# Patient Record
Sex: Female | Born: 2000 | Hispanic: No | Marital: Single | Smoking: Never smoker
Health system: Southern US, Community
[De-identification: ages and names within clinical notes are randomized; demographics above are authoritative.]

## PROBLEM LIST (undated history)

## (undated) DIAGNOSIS — A6000 Herpesviral infection of urogenital system, unspecified: Secondary | ICD-10-CM

## (undated) DIAGNOSIS — M1990 Unspecified osteoarthritis, unspecified site: Secondary | ICD-10-CM

---
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
## 2019-09-11 DIAGNOSIS — Z20822 Contact with and (suspected) exposure to covid-19: Secondary | ICD-10-CM | POA: Diagnosis not present

## 2019-09-11 DIAGNOSIS — Z03818 Encounter for observation for suspected exposure to other biological agents ruled out: Secondary | ICD-10-CM | POA: Diagnosis not present

## 2020-07-14 DIAGNOSIS — D509 Iron deficiency anemia, unspecified: Secondary | ICD-10-CM | POA: Diagnosis not present

## 2020-07-14 DIAGNOSIS — Z79899 Other long term (current) drug therapy: Secondary | ICD-10-CM | POA: Diagnosis not present

## 2020-07-14 DIAGNOSIS — M059 Rheumatoid arthritis with rheumatoid factor, unspecified: Secondary | ICD-10-CM | POA: Diagnosis not present

## 2020-07-19 DIAGNOSIS — A084 Viral intestinal infection, unspecified: Secondary | ICD-10-CM | POA: Diagnosis not present

## 2020-11-02 DIAGNOSIS — N920 Excessive and frequent menstruation with regular cycle: Secondary | ICD-10-CM | POA: Diagnosis not present

## 2020-11-02 DIAGNOSIS — M069 Rheumatoid arthritis, unspecified: Secondary | ICD-10-CM | POA: Diagnosis not present

## 2020-11-02 DIAGNOSIS — D509 Iron deficiency anemia, unspecified: Secondary | ICD-10-CM | POA: Diagnosis not present

## 2021-02-15 DIAGNOSIS — M0580 Other rheumatoid arthritis with rheumatoid factor of unspecified site: Secondary | ICD-10-CM | POA: Diagnosis not present

## 2021-02-15 DIAGNOSIS — M778 Other enthesopathies, not elsewhere classified: Secondary | ICD-10-CM | POA: Diagnosis not present

## 2021-02-15 DIAGNOSIS — D509 Iron deficiency anemia, unspecified: Secondary | ICD-10-CM | POA: Diagnosis not present

## 2021-02-15 DIAGNOSIS — Z79620 Long term (current) use of immunosuppressive biologic: Secondary | ICD-10-CM | POA: Diagnosis not present

## 2021-02-15 DIAGNOSIS — Z8739 Personal history of other diseases of the musculoskeletal system and connective tissue: Secondary | ICD-10-CM | POA: Diagnosis not present

## 2021-11-28 DIAGNOSIS — Z79620 Long term (current) use of immunosuppressive biologic: Secondary | ICD-10-CM | POA: Diagnosis not present

## 2021-11-28 DIAGNOSIS — D509 Iron deficiency anemia, unspecified: Secondary | ICD-10-CM | POA: Diagnosis not present

## 2021-11-28 DIAGNOSIS — M059 Rheumatoid arthritis with rheumatoid factor, unspecified: Secondary | ICD-10-CM | POA: Diagnosis not present

## 2022-05-17 ENCOUNTER — Encounter (HOSPITAL_COMMUNITY): Payer: Self-pay | Admitting: Emergency Medicine

## 2022-05-17 ENCOUNTER — Emergency Department (HOSPITAL_COMMUNITY): Payer: Medicaid Other

## 2022-05-17 ENCOUNTER — Other Ambulatory Visit: Payer: Self-pay

## 2022-05-17 ENCOUNTER — Emergency Department (HOSPITAL_COMMUNITY)
Admission: EM | Admit: 2022-05-17 | Discharge: 2022-05-17 | Disposition: A | Discharge status: Home / Self Care | Payer: Medicaid Other | Attending: Emergency Medicine | Admitting: Emergency Medicine

## 2022-05-17 DIAGNOSIS — K8020 Calculus of gallbladder without cholecystitis without obstruction: Secondary | ICD-10-CM | POA: Diagnosis not present

## 2022-05-17 DIAGNOSIS — N3000 Acute cystitis without hematuria: Secondary | ICD-10-CM | POA: Insufficient documentation

## 2022-05-17 DIAGNOSIS — R1013 Epigastric pain: Secondary | ICD-10-CM | POA: Diagnosis present

## 2022-05-17 LAB — URINALYSIS, ROUTINE W REFLEX MICROSCOPIC
Bilirubin Urine: NEGATIVE
Glucose, UA: NEGATIVE mg/dL
Hgb urine dipstick: NEGATIVE
Ketones, ur: NEGATIVE mg/dL
Nitrite: NEGATIVE
Protein, ur: NEGATIVE mg/dL
Specific Gravity, Urine: 1.03 (ref 1.005–1.030)
pH: 5 (ref 5.0–8.0)

## 2022-05-17 LAB — COMPREHENSIVE METABOLIC PANEL
ALT: 13 U/L (ref 0–44)
AST: 20 U/L (ref 15–41)
Albumin: 3.9 g/dL (ref 3.5–5.0)
Alkaline Phosphatase: 69 U/L (ref 38–126)
Anion gap: 8 (ref 5–15)
BUN: 16 mg/dL (ref 6–20)
CO2: 22 mmol/L (ref 22–32)
Calcium: 8.7 mg/dL — ABNORMAL LOW (ref 8.9–10.3)
Chloride: 106 mmol/L (ref 98–111)
Creatinine, Ser: 0.58 mg/dL (ref 0.44–1.00)
GFR, Estimated: >60 mL/min (ref >60)
Glucose, Bld: 105 mg/dL — ABNORMAL HIGH (ref 70–99)
Potassium: 3.8 mmol/L (ref 3.5–5.1)
Sodium: 136 mmol/L (ref 135–145)
Total Bilirubin: 0.3 mg/dL (ref 0.3–1.2)
Total Protein: 7.3 g/dL (ref 6.5–8.1)

## 2022-05-17 LAB — CBC
HCT: 37.1 % (ref 36.0–46.0)
Hemoglobin: 11.2 g/dL — ABNORMAL LOW (ref 12.0–15.0)
MCH: 20.6 pg — ABNORMAL LOW (ref 26.0–34.0)
MCHC: 30.2 g/dL (ref 30.0–36.0)
MCV: 68.3 fL — ABNORMAL LOW (ref 80.0–100.0)
Platelets: 272 10*3/uL (ref 150–400)
RBC: 5.43 MIL/uL — ABNORMAL HIGH (ref 3.87–5.11)
RDW: 17.6 % — ABNORMAL HIGH (ref 11.5–15.5)
WBC: 13 10*3/uL — ABNORMAL HIGH (ref 4.0–10.5)
nRBC: 0 % (ref 0.0–0.2)

## 2022-05-17 LAB — I-STAT BETA HCG BLOOD, ED (MC, WL, AP ONLY): I-stat hCG, quantitative: <5 m[IU]/mL (ref <5)

## 2022-05-17 LAB — LIPASE, BLOOD: Lipase: 39 U/L (ref 11–51)

## 2022-05-17 MED ORDER — IOHEXOL 350 MG/ML SOLN
75.0000 mL | Freq: Once | INTRAVENOUS | Status: AC | PRN
  Administered 2022-05-17: 75 mL via INTRAVENOUS

## 2022-05-17 MED ORDER — CEPHALEXIN 250 MG PO CAPS
500.0000 mg | ORAL_CAPSULE | Freq: Once | ORAL | Status: AC
  Administered 2022-05-17: 500 mg via ORAL
  Filled 2022-05-17: qty 2

## 2022-05-17 MED ORDER — CEPHALEXIN 500 MG PO CAPS: 500.0000 mg | ORAL_CAPSULE | Freq: Four times a day (QID) | ORAL | 0 refills | Status: DC

## 2022-05-17 NOTE — ED Triage Notes (Signed)
Pt. Stated, I've had stomach pain with diarrhea for a week.

## 2022-05-17 NOTE — ED Notes (Signed)
US at bedside

## 2022-05-17 NOTE — Discharge Instructions (Addendum)
Urinalysis suggestive of urinary tract infection.  Take the antibiotic Keflex as directed for the next 7 days.  Urine sent for culture.  You will be contacted if that antibiotic is not going to work against the urinary tract infection.  Ultrasound showed no evidence of infection in the gallbladder but there are gallstones and most likely this is causing your epigastric discomfort.  Make an appointment to follow-up with general surgery for elective gallbladder removal.  In the meantime avoid fatty foods.  Return for any pain that is severe lasting 3 hours or longer or if persistent vomiting.  Or fevers.

## 2022-05-17 NOTE — ED Notes (Signed)
EDP at BS 

## 2022-05-17 NOTE — ED Provider Notes (Addendum)
[place] [facility] Provider [facility]   CSN: [id number] Arrival date & time: 05/17/22  [id number]     History  Chief Complaint  Patient presents with   Abdominal Pain   Diarrhea    Diamond MorinMaria F Fonseca Ward is a 22 y.o. female.  Patient with a complaint of abdominal pain mostly epigastric for 1 week associated with diarrhea.  No nausea no vomiting.  No blood in the diarrhea.  Temp here 98.2 pulse 63 respirations 16 blood pressure 142/80 oxygen saturation 100% on room air past medical history significant for rheumatoid arthritis juvenile onset [facility].  Patient is never used tobacco products.       Home Medications Prior to Admission medications   Medication Sig Start Date End Date Taking? Authorizing Provider  cephALEXin (KEFLEX) 500 MG capsule Take 1 capsule (500 mg total) by mouth 4 (four) times daily. 05/17/22  Yes Vanetta MuldersZackowski, Nailyn Dearinger, MD      Allergies    Patient has no allergy information on record.    Review of Systems   Review of Systems  Constitutional:  Negative for chills and fever.  HENT:  Negative for ear pain and sore throat.   Eyes:  Negative for pain and visual disturbance.  Respiratory:  Negative for cough and shortness of breath.   Cardiovascular:  Negative for chest pain and palpitations.  Gastrointestinal:  Positive for abdominal pain and diarrhea. Negative for vomiting.  Genitourinary:  Negative for dysuria and hematuria.  Musculoskeletal:  Negative for arthralgias and back pain.  Skin:  Negative for color change and rash.  Neurological:  Negative for seizures and syncope.  All other systems reviewed and are negative.   Physical Exam Updated Vital Signs BP 126/75 (BP Location: Left Arm)   Pulse 61   Temp 97.9 F (36.6 C) (Oral)   Resp 18   Ht 1.524 m (5')   Wt 65.8 kg   LMP 04/26/2022   SpO2 100%   BMI 28.32 kg/m  Physical Exam Vitals and nursing note reviewed.  Constitutional:      General:  She is not in acute distress.    Appearance: Normal appearance. She is well-developed. She is not ill-appearing.  HENT:     Head: Normocephalic and atraumatic.     Mouth/Throat:     Mouth: Mucous membranes are moist.  Eyes:     Conjunctiva/sclera: Conjunctivae normal.  Cardiovascular:     Rate and Rhythm: Normal rate and regular rhythm.     Heart sounds: No murmur heard. Pulmonary:     Effort: Pulmonary effort is normal. No respiratory distress.     Breath sounds: Normal breath sounds.  Abdominal:     General: There is no distension.     Palpations: Abdomen is soft.     Tenderness: There is no abdominal tenderness. There is no guarding.  Musculoskeletal:        General: No swelling.     Cervical back: Neck supple.  Skin:    General: Skin is warm and dry.     Capillary Refill: Capillary refill takes less than 2 seconds.  Neurological:     General: No focal deficit present.     Mental Status: She is alert and oriented to person, place, and time.  Psychiatric:        Mood and Affect: Mood normal.     ED Results / Procedures / Treatments   Labs (all labs ordered are listed, but only abnormal results  are displayed) Labs Reviewed  COMPREHENSIVE METABOLIC PANEL - Abnormal; Notable for the following components:      Result Value   Glucose, Bld 105 (*)    Calcium 8.7 (*)    All other components within normal limits  CBC - Abnormal; Notable for the following components:   WBC 13.0 (*)    RBC 5.43 (*)    Hemoglobin 11.2 (*)    MCV 68.3 (*)    MCH 20.6 (*)    RDW 17.6 (*)    All other components within normal limits  URINALYSIS, ROUTINE W REFLEX MICROSCOPIC - Abnormal; Notable for the following components:   APPearance HAZY (*)    Leukocytes,Ua MODERATE (*)    Bacteria, UA MANY (*)    All other components within normal limits  URINE CULTURE  LIPASE, BLOOD  I-STAT BETA HCG BLOOD, ED ([facility], [facility], [facility] ONLY)    EKG EKG Interpretation  Date/Time:  Friday May 17 2022 09:19:00  EDT Ventricular Rate:  59 PR Interval:  142 QRS Duration: 93 QT Interval:  409 QTC Calculation: 406 R Axis:   82 Text Interpretation: Sinus rhythm Confirmed by Vanetta Mulders [phone]) on 05/17/2022 9:21:49 AM  Radiology US Abdomen Limited  Result Date: 05/17/2022 CLINICAL DATA:  Epigastric pain for 1 week with diarrhea Distended gallbladder seen on CT scan EXAM: ULTRASOUND ABDOMEN LIMITED RIGHT UPPER QUADRANT COMPARISON:  CT abdomen pelvis 05/17/2022 FINDINGS: Gallbladder: Gallstones: Present Sludge: None Gallbladder Wall: Within normal limits Pericholecystic fluid: None Sonographic Murphy's Sign: Negative per technologist Common bile duct: Diameter: 5 mm Liver: Parenchymal echogenicity: Within normal limits Contours: Normal Lesions: None Portal vein: Patent.  Hepatopetal flow Other: None. IMPRESSION: Cholelithiasis without additional sonographic evidence of acute cholecystitis. Electronically Signed   By: Acquanetta Belling M.D.   On: 05/17/2022 14:26   CT Abdomen Pelvis W Contrast  Result Date: 05/17/2022 CLINICAL DATA:  Epigastric pain for 1 week with diarrhea. EXAM: CT ABDOMEN AND PELVIS WITH CONTRAST TECHNIQUE: Multidetector CT imaging of the abdomen and pelvis was performed using the standard protocol following bolus administration of intravenous contrast. RADIATION DOSE REDUCTION: This exam was performed according to the departmental dose-optimization program which includes automated exposure control, adjustment of the mA and/or kV according to patient size and/or use of iterative reconstruction technique. CONTRAST:  75mL OMNIPAQUE IOHEXOL 350 MG/ML SOLN COMPARISON:  None Available. FINDINGS: Lower chest: The lung bases are clear. The imaged heart is unremarkable. Hepatobiliary: The liver is unremarkable. The gallbladder is distended. There are no radiopaque stones. There is mild wall thickening and possible pericholecystic fluid around the gallbladder neck. There is no biliary ductal dilatation.  Pancreas: Unremarkable. Spleen: Unremarkable. Adrenals/Urinary Tract: The adrenals are unremarkable. A subcentimeter hypodense lesion in the left upper pole likely reflects a benign cyst requiring no specific imaging follow-up. There are no other focal lesions. There are no stones in either kidney or along the course of either ureter. There is no hydronephrosis or hydroureter. The bladder is unremarkable. Stomach/Bowel: The stomach is unremarkable. There is no evidence of bowel obstruction. The appendix is normal. There is no abnormal bowel wall thickening or inflammatory change. Vascular/Lymphatic: The abdominal aorta is normal in course and caliber. The major branch vessels are patent. The main portal and splenic veins are patent. There is no abdominal or pelvic lymphadenopathy. Reproductive: The uterus and adnexa are unremarkable. Other: There is no ascites or free air. Musculoskeletal: There is no acute osseous abnormality or suspicious osseous lesion. IMPRESSION: 1. Distended gallbladder with mild wall  thickening and trace pericholecystic fluid. No radiopaque stones are identified. Correlate with history and symptoms and consider right upper quadrant ultrasound for further evaluation. 2. No other acute findings in the abdomen or pelvis. Electronically Signed   By: Lesia HausenPeter  Noone M.D.   On: 05/17/2022 10:42    Procedures Procedures    Medications Ordered in ED Medications  iohexol (OMNIPAQUE) 350 MG/ML injection 75 mL (75 mLs Intravenous Contrast Given 05/17/22 1033)  cephALEXin (KEFLEX) capsule 500 mg (500 mg Oral Given 05/17/22 1351)    ED Course/ Medical Decision Making/ A&P                             Medical Decision Making Amount and/or Complexity of Data Reviewed Labs: ordered. Radiology: ordered.  Risk Prescription drug management.  Patient's pregnancy test negative.  Urinalysis complete metabolic panel CBC and lipase are pending.  Because of the diarrhea along with the epigastric  abdominal pain will get CT scan abdomen and pelvis to start with.  Certainly gallbladder cholelithiasis is a possibility but patient nontoxic no acute distress.  Will also get EKG since epigastric abdominal pain.  Patient without any respiratory symptoms no true chest pain however.  Patient's liver function test without any significant abnormality.  But CT scan raising some concerns about some dilatation and thickening of the gallbladder.  But no evidence of any stones.  Patient currently still with some discomfort in the right upper quadrant but no significant tenderness or guarding.  Will get ultrasound she wants to do ultrasound today.  Urinalysis concerning for urinary tract infection.  Urine sent for culture.  Will start on Keflex for that.  Patient does have primary care doctor.  Ultrasound shows gallstones without evidence of acute cholecystitis, bile ducts normal.  This would explain patient's epigastric abdominal pain will refer to general surgery for consideration for elective gallbladder removal.  In the meantime patient will take Keflex for the urinary tract infection.  Final Clinical Impression(s) / ED Diagnoses Final diagnoses:  Epigastric pain  Acute cystitis without hematuria  Calculus of gallbladder without cholecystitis without obstruction    Rx / DC Orders ED Discharge Orders          Ordered    cephALEXin (KEFLEX) 500 MG capsule  4 times daily        05/17/22 1334              Vanetta MuldersZackowski, Nina Mondor, MD 05/17/22 [id number]    Vanetta MuldersZackowski, Tynell Winchell, MD 05/17/22 [id number]    Vanetta MuldersZackowski, Phillips Goulette, MD 05/17/22 1336    Vanetta MuldersZackowski, Lanee Chain, MD 05/17/22 1459

## 2022-05-18 LAB — URINE CULTURE

## 2022-05-19 LAB — URINE CULTURE: Culture: >=100000 — AB

## 2022-05-20 ENCOUNTER — Telehealth (HOSPITAL_BASED_OUTPATIENT_CLINIC_OR_DEPARTMENT_OTHER): Payer: Self-pay | Admitting: *Deleted

## 2022-05-20 NOTE — Telephone Encounter (Signed)
Post ED Visit - Positive Culture Follow-up  Culture report reviewed by antimicrobial stewardship pharmacist: [facility] []  Enzo Bi, Pharm.D. []  Celedonio Miyamoto, [address].D., BCPS AQ-ID []  Garvin Fila, Pharm.D., BCPS []  Georgina Pillion, Pharm.D., BCPS []  [place], [place].D., BCPS, AAHIVP []  Estella Husk, Pharm.D., BCPS, AAHIVP []  Lysle Pearl, PharmD, BCPS []  Phillips Climes, PharmD, BCPS []  Agapito Games, PharmD, BCPS []  Verlan Friends, PharmD []  Mervyn Gay, PharmD, BCPS [x]  Jerline Pain, PharmD  [facility] []  Len Childs, PharmD []  Greer Pickerel, PharmD []  Adalberto Cole, PharmD []  Perlie Gold, Rph []  Lonell Face) Jean Rosenthal, PharmD []  Earl Many, PharmD []  Junita Push, PharmD []  Dorna Leitz, PharmD []  Terrilee Files, PharmD []  Lynann Beaver, PharmD []  Keturah Barre, PharmD []  Loralee Pacas, PharmD []  Bernadene Person, PharmD   Positive urine culture Treated with Cephalexin, organism sensitive to the same and no further patient follow-up is required at this time.  Diamond Ward 05/20/2022, 10:33 AM

## 2022-11-10 ENCOUNTER — Ambulatory Visit (HOSPITAL_COMMUNITY)
Admission: EM | Admit: 2022-11-10 | Discharge: 2022-11-10 | Disposition: A | Discharge status: Home / Self Care | Payer: Medicaid Other | Attending: Emergency Medicine | Admitting: Emergency Medicine

## 2022-11-10 ENCOUNTER — Encounter (HOSPITAL_COMMUNITY): Payer: Self-pay | Admitting: Emergency Medicine

## 2022-11-10 DIAGNOSIS — N3000 Acute cystitis without hematuria: Secondary | ICD-10-CM | POA: Diagnosis not present

## 2022-11-10 DIAGNOSIS — N898 Other specified noninflammatory disorders of vagina: Secondary | ICD-10-CM

## 2022-11-10 LAB — POCT URINALYSIS DIP (MANUAL ENTRY)
Bilirubin, UA: NEGATIVE
Glucose, UA: NEGATIVE mg/dL
Nitrite, UA: NEGATIVE
Spec Grav, UA: >=1.03 — AB (ref 1.010–1.025)
Urobilinogen, UA: 0.2 U/dL
pH, UA: 5.5 (ref 5.0–8.0)

## 2022-11-10 MED ORDER — IBUPROFEN 800 MG PO TABS
800.0000 mg | ORAL_TABLET | Freq: Once | ORAL | Status: AC
  Administered 2022-11-10: 800 mg via ORAL

## 2022-11-10 MED ORDER — IBUPROFEN 800 MG PO TABS: 800.0000 mg | ORAL_TABLET | Freq: Three times a day (TID) | ORAL | 0 refills | Status: DC

## 2022-11-10 MED ORDER — IBUPROFEN 800 MG PO TABS
ORAL_TABLET | ORAL | Status: AC
  Filled 2022-11-10: qty 1

## 2022-11-10 MED ORDER — VALACYCLOVIR HCL 1 G PO TABS: 1000.0000 mg | ORAL_TABLET | Freq: Every day | ORAL | 0 refills | Status: AC

## 2022-11-10 MED ORDER — TRIPLE ANTIBIOTIC 5-400-5000 EX OINT: TOPICAL_OINTMENT | Freq: Four times a day (QID) | CUTANEOUS | 0 refills | Status: DC

## 2022-11-10 MED ORDER — SULFAMETHOXAZOLE-TRIMETHOPRIM 800-160 MG PO TABS: 1.0000 | ORAL_TABLET | Freq: Two times a day (BID) | ORAL | 0 refills | Status: AC

## 2022-11-10 NOTE — ED Triage Notes (Signed)
Pt reports dysuria since Thursday. Pt noticed bumps in genital area that same day. Pt was taking Amoxicillin that got from a [place] store Friday morning and then one last night. Also AZO once on Friday.

## 2022-11-10 NOTE — Discharge Instructions (Addendum)
I am treating you for a urinary tract infection with Bactrim, take all antibiotics as prescribed and until finished. Apply topical antibacterial ointment to external lesions.   We swabbed your lesions for HSV, also known as herpes. We will contact you if this swab results as positive. I have sent in Valtrex to the pharmacy for you to take as prescribed if positive. Please do not have intercourse when you have open lesions as this is painful and can risk transmission if this is the HSV virus.   Return to clinic for new or urgent symptoms.

## 2022-11-10 NOTE — ED Provider Notes (Signed)
[facility]    CSN: [id number] Arrival date & time: 11/10/22  1322      History   Chief Complaint Chief Complaint  Patient presents with   Dysuria   bumps    HPI Diamond Ward is a 22 y.o. female.   Patient presents to clinic complaining of dysuria that started Thursday.  She did notice she had bumps in her genital area on Thursday as well. She has lesions on her labia that are very painful when she urinates and wipes. She tried some amoxicillin and AZO for her symptoms without much relief.   She denies any nausea, vomiting, fevers or flank pain.  Denies any changes to vaginal discharge.  Reports she does not have intercourse often but she did have intercourse Wednesday with her long-term boyfriend. No hx of genital herpes or STIs.   The history is provided by the patient and medical records.  Dysuria Associated symptoms: no fever, no flank pain and no vaginal discharge     History reviewed. No pertinent past medical history.  There are no problems to display for this patient.   History reviewed. No pertinent surgical history.  OB History   No obstetric history on file.      Home Medications    Prior to Admission medications   Medication Sig Start Date End Date Taking? Authorizing Provider  ibuprofen (ADVIL) 800 MG tablet Take 1 tablet (800 mg total) by mouth 3 (three) times daily. 11/10/22  Yes Rinaldo Ratel, [place] N, FNP  neomycin-bacitracin-polymyxin (NEOSPORIN) [phone] ointment Apply topically 4 (four) times daily. 11/10/22  Yes Rinaldo Ratel, [place] N, FNP  sulfamethoxazole-trimethoprim (BACTRIM DS) 800-160 MG tablet Take 1 tablet by mouth 2 (two) times daily for 3 days. 11/10/22 11/13/22 Yes Rinaldo Ratel, [place] N, FNP  valACYclovir (VALTREX) 1000 MG tablet Take 1 tablet (1,000 mg total) by mouth daily for 10 days. 11/10/22 11/20/22 Yes Rinaldo Ratel, [place] N, FNP  cephALEXin (KEFLEX) 500 MG capsule Take 1 capsule (500 mg total) by mouth 4 (four) times  daily. 05/17/22   Vanetta Mulders, MD    Family History No family history on file.  Social History Social History   Tobacco Use   Smoking status: Never   Smokeless tobacco: Never  Substance Use Topics   Alcohol use: Yes   Drug use: Not Currently     Allergies   Patient has no known allergies.   Review of Systems Review of Systems  Constitutional:  Negative for fever.  Genitourinary:  Positive for dysuria and genital sores. Negative for flank pain, urgency, vaginal bleeding, vaginal discharge and vaginal pain.  Musculoskeletal:  Negative for back pain.     Physical Exam Triage Vital Signs ED Triage Vitals  Encounter Vitals Group     BP 11/10/22 1406 122/82     Systolic BP Percentile --      Diastolic BP Percentile --      Pulse Rate 11/10/22 1406 81     Resp 11/10/22 1406 17     Temp 11/10/22 1406 99.3 F (37.4 C)     Temp Source 11/10/22 1406 Oral     SpO2 11/10/22 1406 98 %     Weight --      Height --      Head Circumference --      Peak Flow --      Pain Score 11/10/22 1404 10     Pain Loc --      Pain Education --      Exclude  from Growth Chart --    No data found.  Updated Vital Signs BP 122/82 (BP Location: Left Arm)   Pulse 81   Temp 99.3 F (37.4 C) (Oral)   Resp 17   LMP 11/06/2022   SpO2 98%   Visual Acuity Right Eye Distance:   Left Eye Distance:   Bilateral Distance:    Right Eye Near:   Left Eye Near:    Bilateral Near:     Physical Exam Vitals and nursing note reviewed. Exam conducted with a chaperone present.  Constitutional:      Appearance: Normal appearance.  HENT:     Head: Normocephalic and atraumatic.     Right Ear: External ear normal.     Left Ear: External ear normal.     Nose: Nose normal.     Mouth/Throat:     Mouth: Mucous membranes are moist.  Eyes:     Conjunctiva/sclera: Conjunctivae normal.  Cardiovascular:     Rate and Rhythm: Normal rate.  Pulmonary:     Effort: Pulmonary effort is normal.   Genitourinary:    Exam position: Lithotomy position.     Labia:        Right: Lesion present.        Left: Lesion present.      Comments: Multiple open lesions to labia minora and majora.  Areas are tender to touch and circular.  Does have isolated folliculitis to pubic area. Musculoskeletal:        General: Normal range of motion.  Skin:    General: Skin is warm and dry.  Neurological:     General: No focal deficit present.     Mental Status: She is alert and oriented to person, place, and time.  Psychiatric:        Mood and Affect: Mood normal.        Behavior: Behavior normal. Behavior is cooperative.      UC Treatments / Results  Labs (all labs ordered are listed, but only abnormal results are displayed) Labs Reviewed  POCT URINALYSIS DIP (MANUAL ENTRY) - Abnormal; Notable for the following components:      Result Value   Ketones, POC UA trace (5) (*)    Spec Grav, UA >=1.030 (*)    Blood, UA moderate (*)    Protein Ur, POC trace (*)    Leukocytes, UA Small (1+) (*)    All other components within normal limits  HSV CULTURE AND TYPING  URINE CULTURE    EKG   Radiology No results found.  Procedures Procedures (including critical care time)  Medications Ordered in UC Medications  ibuprofen (ADVIL) tablet 800 mg (has no administration in time range)    Initial Impression / Assessment and Plan / UC Course  I have reviewed the triage vital signs and the nursing notes.  Pertinent labs & imaging results that were available during my care of the patient were reviewed by me and considered in my medical decision making (see chart for details).  Vitals and triage reviewed, patient is hemodynamically stable.  Has dysuria, UA with leukocytes and blood, concerning for UTI.  Will place on Bactrim and send urine for culture.  Multiple sores on labia minora and majora on physical exam, concern for HSV, swabbed.  Send in Valtrex to pharmacy.  Plan of care, follow-up care and  return precautions given, no questions at this time.     Final Clinical Impressions(s) / UC Diagnoses   Final diagnoses:  Acute cystitis without  hematuria  Vaginal lesion     Discharge Instructions      I am treating you for a urinary tract infection with Bactrim, take all antibiotics as prescribed and until finished. Apply topical antibacterial ointment to external lesions.   We swabbed your lesions for HSV, also known as herpes. We will contact you if this swab results as positive. I have sent in Valtrex to the pharmacy for you to take as prescribed if positive. Please do not have intercourse when you have open lesions as this is painful and can risk transmission if this is the HSV virus.   Return to clinic for new or urgent symptoms.       ED Prescriptions     Medication Sig Dispense Auth. Provider   sulfamethoxazole-trimethoprim (BACTRIM DS) 800-160 MG tablet Take 1 tablet by mouth 2 (two) times daily for 3 days. 6 tablet Rinaldo Ratel, [place], [place]   valACYclovir (VALTREX) 1000 MG tablet Take 1 tablet (1,000 mg total) by mouth daily for 10 days. 10 tablet Rinaldo Ratel, [place], [place]   neomycin-bacitracin-polymyxin (NEOSPORIN) [phone] ointment Apply topically 4 (four) times daily. 28.3 g Diamond Ward, [place], [place]   ibuprofen (ADVIL) 800 MG tablet Take 1 tablet (800 mg total) by mouth 3 (three) times daily. 21 tablet Diamond Ward, [place], [place]      PDMP not reviewed this encounter.   Diamond Ward, [place] N, [place] 11/10/22 1450

## 2022-11-11 LAB — URINE CULTURE: Culture: NO GROWTH

## 2022-11-12 ENCOUNTER — Encounter (HOSPITAL_COMMUNITY): Payer: Self-pay

## 2022-11-12 ENCOUNTER — Other Ambulatory Visit: Payer: Self-pay

## 2022-11-12 ENCOUNTER — Emergency Department (HOSPITAL_COMMUNITY)
Admission: EM | Admit: 2022-11-12 | Discharge: 2022-11-12 | Disposition: A | Discharge status: Home / Self Care | Payer: Medicaid Other | Attending: Emergency Medicine | Admitting: Emergency Medicine

## 2022-11-12 DIAGNOSIS — N7689 Other specified inflammation of vagina and vulva: Secondary | ICD-10-CM | POA: Diagnosis present

## 2022-11-12 DIAGNOSIS — A6004 Herpesviral vulvovaginitis: Secondary | ICD-10-CM | POA: Diagnosis not present

## 2022-11-12 DIAGNOSIS — A6000 Herpesviral infection of urogenital system, unspecified: Secondary | ICD-10-CM | POA: Diagnosis not present

## 2022-11-12 MED ORDER — HYDROCODONE-ACETAMINOPHEN 5-325 MG PO TABS: 2.0000 | ORAL_TABLET | ORAL | 0 refills | Status: DC | PRN

## 2022-11-12 MED ORDER — VALACYCLOVIR HCL 500 MG PO TABS
1000.0000 mg | ORAL_TABLET | Freq: Once | ORAL | Status: AC
  Administered 2022-11-12: 1000 mg via ORAL
  Filled 2022-11-12: qty 2

## 2022-11-12 MED ORDER — LIDOCAINE HCL URETHRAL/MUCOSAL 2 % EX GEL
1.0000 | Freq: Once | CUTANEOUS | Status: AC
  Administered 2022-11-12: 1 via TOPICAL
  Filled 2022-11-12: qty 11

## 2022-11-12 MED ORDER — VALACYCLOVIR HCL 1 G PO TABS: 1000.0000 mg | ORAL_TABLET | Freq: Two times a day (BID) | ORAL | 0 refills | Status: AC

## 2022-11-12 MED ORDER — LIDOCAINE HCL 2 % EX GEL: 1.0000 | CUTANEOUS | 0 refills | Status: DC | PRN

## 2022-11-12 MED ORDER — HYDROCODONE-ACETAMINOPHEN 5-325 MG PO TABS
1.0000 | ORAL_TABLET | Freq: Once | ORAL | Status: AC
  Administered 2022-11-12: 1 via ORAL
  Filled 2022-11-12: qty 1

## 2022-11-12 NOTE — ED Provider Notes (Signed)
[place] [facility] AT [facility] Provider Note   CSN: [id number] Arrival date & time: 11/12/22  0007     History  Chief Complaint  Patient presents with   Genital Swelling     Diamond Ward is a 22 y.o. female.  Patient is a healthy 22 year old female who is presenting today with worsening pain and swelling in her labia and vaginal area.  She was seen yesterday and was told she had a urinary tract infection but have been given a prescription for Valtrex due to possibility that she could have genital herpes.  She reports the symptoms are worse today and she is having a lot of pain.  She reports it is painful when she urinates because when the urine comes out it stings and burns.  She has not been sexually active for several months but has been sexually active in the past and has not always used protection.  She denies any vaginal discharge.  She was seen at urgent care yesterday her pregnancy test was negative.  The history is provided by the patient and medical records.       Home Medications Prior to Admission medications   Medication Sig Start Date End Date Taking? Authorizing Provider  HYDROcodone-acetaminophen (NORCO/VICODIN) 5-325 MG tablet Take 2 tablets by mouth every 4 (four) hours as needed. 11/12/22  Yes Jaylena Holloway, Alphonzo Lemmings, MD  lidocaine (XYLOCAINE) 2 % jelly Apply 1 Application topically as needed. Place in the vaginal area where there is pain 11/12/22  Yes Kadeem Hyle, Alphonzo Lemmings, MD  valACYclovir (VALTREX) 1000 MG tablet Take 1 tablet (1,000 mg total) by mouth 2 (two) times daily for 10 days. 11/12/22 11/22/22 Yes Verlin Uher, Alphonzo Lemmings, MD  cephALEXin (KEFLEX) 500 MG capsule Take 1 capsule (500 mg total) by mouth 4 (four) times daily. 05/17/22   Vanetta Mulders, MD  ibuprofen (ADVIL) 800 MG tablet Take 1 tablet (800 mg total) by mouth 3 (three) times daily. 11/10/22   Garrison, [place] N, FNP  neomycin-bacitracin-polymyxin (NEOSPORIN) [phone] ointment  Apply topically 4 (four) times daily. 11/10/22   Garrison, [place] N, FNP  sulfamethoxazole-trimethoprim (BACTRIM DS) 800-160 MG tablet Take 1 tablet by mouth 2 (two) times daily for 3 days. 11/10/22 11/13/22  Garrison, [place] N, FNP  valACYclovir (VALTREX) 1000 MG tablet Take 1 tablet (1,000 mg total) by mouth daily for 10 days. 11/10/22 11/20/22  Garrison, [place] N, FNP      Allergies    Patient has no known allergies.    Review of Systems   Review of Systems  Physical Exam Updated Vital Signs BP 108/65   Pulse 62   Temp 97.8 F (36.6 C)   Resp 16   Ht 5' (1.524 m)   LMP 11/06/2022   SpO2 100%   BMI 28.32 kg/m  Physical Exam Vitals and nursing note reviewed. Exam conducted with a chaperone present.  HENT:     Head: Normocephalic.  Cardiovascular:     Rate and Rhythm: Normal rate.  Pulmonary:     Effort: Pulmonary effort is normal. No respiratory distress.     Breath sounds: No wheezing.  Abdominal:     General: Abdomen is flat. There is no distension.     Palpations: Abdomen is soft.     Tenderness: There is no abdominal tenderness.  Genitourinary:    Labia:        Right: Rash, tenderness and lesion present.        Left: Rash, tenderness and lesion present.  Comments: Diffuse vesicular lesions over the labia majora, minora, near the meatus.  Labia bilaterally are swollen and macerated.  Vesicular lesions seen throughout the genital exam as well as in the vaginal canal Skin:    General: Skin is warm.  Neurological:     Mental Status: She is alert. Mental status is at baseline.  Psychiatric:        Mood and Affect: Mood normal.     ED Results / Procedures / Treatments   Labs (all labs ordered are listed, but only abnormal results are displayed) Labs Reviewed - No data to display  EKG None  Radiology No results found.  Procedures Procedures    Medications Ordered in ED Medications  lidocaine (XYLOCAINE) 2 % jelly 1 Application (1 Application Topical  Given 11/12/22 0819)  HYDROcodone-acetaminophen (NORCO/VICODIN) 5-325 MG per tablet 1 tablet (1 tablet Oral Given 11/12/22 0818)  valACYclovir (VALTREX) tablet 1,000 mg (1,000 mg Oral Given 11/12/22 0818)    ED Course/ Medical Decision Making/ A&P                                 Medical Decision Making Risk Prescription drug management.   Patient presenting today with physical exam findings most consistent with genital herpes.  This would be patient's initial outbreak.  She was seen yesterday at urgent care and at that time they did swab her for herpes and gave her a prescription for Valtrex but told her to not take it and she was given Bactrim for concern for possible UTI.  However looking at the urine results she only had small leukocytes and based on patient's description of it just being painful when the urine comes out and touches her skin and based on her physical exam do not feel this is a UTI and feel this is more herpes.  Patient counseled to discontinue the Bactrim and start the Valtrex.  She was given pain medication and lidocaine jelly given the severity of her outbreak.  She was given information about herpes and follow-up with the health department as well as informing her prior partners.        Final Clinical Impression(s) / ED Diagnoses Final diagnoses:  Herpes simplex vulvovaginitis    Rx / DC Orders ED Discharge Orders          Ordered    HYDROcodone-acetaminophen (NORCO/VICODIN) 5-325 MG tablet  Every 4 hours PRN        11/12/22 0821    valACYclovir (VALTREX) 1000 MG tablet  2 times daily        11/12/22 0821    lidocaine (XYLOCAINE) 2 % jelly  As needed        11/12/22 2426              Gwyneth Sprout, MD 11/12/22 [phone]

## 2022-11-12 NOTE — ED Triage Notes (Addendum)
Pt coming in via POV. Pt states she went to UC yesterday and was told she had a UTI and has been taking abx since but has noticed swelling to labia. She states the swelling started last night but reports cuts around labia that started on Thursday. She reports dysuria. Denies vaginal discharge, bleeding, and abdominal pain. Denies SOB, CP, n/v/d. Pt alert and oriented x4.

## 2022-11-12 NOTE — Discharge Instructions (Addendum)
Based on your exam today it appears that you have herpes.  You can continue to follow-up with the cultures that are still pending.  Based on your urine results it does not appear that you have a urinary tract infection and you can stop those antibiotics.  Stop the Bactrim.  Continue taking the Motrin every 6 hours but if that and the lidocaine jelly do not control the pain you can take an additional pain pill that you are prescribed.  You are given your first dose of Valtrex here so you can start the next dose this evening and you need to take it twice a day.  This should help with the outbreak. You need to take the Valtrex 1000mg  two times a day for 10 days.  Then with the left over pills in the future if you start getting an outbreak you can take 1 tab 1 time a day until the outbreak is over.

## 2022-11-13 LAB — HSV CULTURE AND TYPING

## 2023-04-22 ENCOUNTER — Ambulatory Visit (HOSPITAL_COMMUNITY)
Admission: EM | Admit: 2023-04-22 | Discharge: 2023-04-22 | Disposition: A | Discharge status: Home / Self Care | Attending: Family Medicine | Admitting: Family Medicine

## 2023-04-22 ENCOUNTER — Encounter (HOSPITAL_COMMUNITY): Payer: Self-pay | Admitting: Emergency Medicine

## 2023-04-22 DIAGNOSIS — R102 Pelvic and perineal pain: Secondary | ICD-10-CM

## 2023-04-22 DIAGNOSIS — A6000 Herpesviral infection of urogenital system, unspecified: Secondary | ICD-10-CM

## 2023-04-22 HISTORY — DX: Herpesviral infection of urogenital system, unspecified: A60.00

## 2023-04-22 MED ORDER — LIDOCAINE HCL 2 % EX GEL: 1.0000 | Freq: Three times a day (TID) | CUTANEOUS | 0 refills | Status: DC | PRN

## 2023-04-22 MED ORDER — VALACYCLOVIR HCL 500 MG PO TABS: 500.0000 mg | ORAL_TABLET | Freq: Two times a day (BID) | ORAL | 1 refills | Status: AC

## 2023-04-22 NOTE — Discharge Instructions (Signed)
 Valtrex 500 mg--1 tablet twice daily for 3 days.  Apply lidocaine jelly to the painful area up to 3 times daily as needed for pain..  You can use the QR code/website at the back of the summary paperwork to schedule yourself a new patient appointment with primary care

## 2023-04-22 NOTE — ED Triage Notes (Signed)
 Pt reports that has herpes and believes having an outbreak. Reports two bumps that are painful. Doesnt have Rx for symptoms.

## 2023-04-22 NOTE — ED Provider Notes (Signed)
 [facility]    CSN: [id number] Arrival date & time: 04/22/23  1556      History   Chief Complaint Chief Complaint  Patient presents with   Vaginal Pain    HPI Diamond Ward is a 23 y.o. female.    Vaginal Pain  Here for pain and lesions on her right perineum. Began 2-3 days ago or so. No f/c/dysuria.  Had first herpes outbreak in October 2024  LMP 2/14.    Past Medical History:  Diagnosis Date   Genital herpes     There are no active problems to display for this patient.   History reviewed. No pertinent surgical history.  OB History   No obstetric history on file.      Home Medications    Prior to Admission medications   Medication Sig Start Date End Date Taking? Authorizing Provider  valACYclovir (VALTREX) 500 MG tablet Take 1 tablet (500 mg total) by mouth 2 (two) times daily for 3 days. 04/22/23 04/25/23 Yes Shamera Yarberry, Janace Aris, MD  lidocaine (XYLOCAINE) 2 % jelly Apply 1 Application topically 3 (three) times daily as needed. To the affected area on perineum for pain 04/22/23   Zenia Resides, MD    Family History No family history on file.  Social History Social History   Tobacco Use   Smoking status: Never   Smokeless tobacco: Never  Substance Use Topics   Alcohol use: Yes   Drug use: Not Currently     Allergies   Patient has no known allergies.   Review of Systems Review of Systems  Genitourinary:  Positive for vaginal pain.     Physical Exam Triage Vital Signs ED Triage Vitals  Encounter Vitals Group     BP 04/22/23 1643 114/78     Systolic BP Percentile --      Diastolic BP Percentile --      Pulse Rate 04/22/23 1643 74     Resp 04/22/23 1643 13     Temp 04/22/23 1643 98.4 F (36.9 C)     Temp Source 04/22/23 1643 Oral     SpO2 04/22/23 1643 100 %     Weight --      Height --      Head Circumference --      Peak Flow --      Pain Score 04/22/23 1642 8     Pain Loc --      Pain Education --       Exclude from Growth Chart --    No data found.  Updated Vital Signs BP 114/78 (BP Location: Right Arm)   Pulse 74   Temp 98.4 F (36.9 C) (Oral)   Resp 13   LMP 03/28/2023 (Exact Date)   SpO2 100%   Visual Acuity Right Eye Distance:   Left Eye Distance:   Bilateral Distance:    Right Eye Near:   Left Eye Near:    Bilateral Near:     Physical Exam Vitals reviewed.  Constitutional:      General: She is not in acute distress.    Appearance: She is not ill-appearing, toxic-appearing or diaphoretic.  HENT:     Mouth/Throat:     Mouth: Mucous membranes are moist.  Genitourinary:    Comments: Chaperone present during the time of exam.  There are 2 ulcerative lesions about 3 to 4 mm in size on her right posterior perineum.  No sign of secondary infection Skin:    Coloration: Skin  is not pale.  Neurological:     Mental Status: She is alert and oriented to person, place, and time.  Psychiatric:        Behavior: Behavior normal.      UC Treatments / Results  Labs (all labs ordered are listed, but only abnormal results are displayed) Labs Reviewed - No data to display  EKG   Radiology No results found.  Procedures Procedures (including critical care time)  Medications Ordered in UC Medications - No data to display  Initial Impression / Assessment and Plan / UC Course  I have reviewed the triage vital signs and the nursing notes.  Pertinent labs & imaging results that were available during my care of the patient were reviewed by me and considered in my medical decision making (see chart for details).     Valtrex 500 mg twice daily for 3 days is sent in.  Also she requests some lidocaine topical and that is sent in also. Final Clinical Impressions(s) / UC Diagnoses   Final diagnoses:  None     Discharge Instructions      Valtrex 500 mg--1 tablet twice daily for 3 days.  Apply lidocaine jelly to the painful area up to 3 times daily as needed for  pain..  You can use the QR code/website at the back of the summary paperwork to schedule yourself a new patient appointment with primary care      ED Prescriptions     Medication Sig Dispense Auth. Provider   lidocaine (XYLOCAINE) 2 % jelly Apply 1 Application topically 3 (three) times daily as needed. To the affected area on perineum for pain 30 mL Jamere Stidham, Janace Aris, MD   valACYclovir (VALTREX) 500 MG tablet Take 1 tablet (500 mg total) by mouth 2 (two) times daily for 3 days. 6 tablet Marlinda Mike Janace Aris, MD      PDMP not reviewed this encounter.   Zenia Resides, MD 04/22/23 [phone]

## 2023-07-31 ENCOUNTER — Ambulatory Visit: Admitting: Family Medicine

## 2023-07-31 ENCOUNTER — Encounter: Payer: Self-pay | Admitting: Family Medicine

## 2023-07-31 VITALS — BP 122/80 | HR 59 | Temp 97.9°F | Ht 60.0 in | Wt 140.0 lb

## 2023-07-31 DIAGNOSIS — Z124 Encounter for screening for malignant neoplasm of cervix: Secondary | ICD-10-CM | POA: Diagnosis not present

## 2023-07-31 DIAGNOSIS — B009 Herpesviral infection, unspecified: Secondary | ICD-10-CM

## 2023-07-31 DIAGNOSIS — M069 Rheumatoid arthritis, unspecified: Secondary | ICD-10-CM | POA: Diagnosis not present

## 2023-07-31 MED ORDER — VALACYCLOVIR HCL 500 MG PO TABS: 500.0000 mg | ORAL_TABLET | Freq: Two times a day (BID) | ORAL | 1 refills | Status: DC

## 2023-07-31 NOTE — Patient Instructions (Signed)
 Thank you for trusting us  with your health care.  You should hear from the OB/GYN office to schedule.  You should also hear from rheumatology to schedule.  Follow-up here for labs and preventive healthcare/wellness visit in the next 2 to 3 months.

## 2023-07-31 NOTE — Progress Notes (Signed)
 New Patient Office Visit  Subjective    Patient ID: Diamond Ward, female    DOB: 12-04-2000  Age: 23 y.o. MRN: [id number]  CC: No chief complaint on file.   HPI Diamond Ward presents to establish care Previous PCP: not in years   States she has RA. States she used to go to [facility]. She does not want to go back and requests referral to rheumatology in [place].  Diagnosed at age 35. She used to be on medication, injections, then IV medication. Off of medication for the past 2 years. Doing well on naproxen prn.   Genital herpes and has had 2 flares since her diagnosis.  Needs Valtrex  to take on trip. Traveling to [place] soon.   No children.  Single.  No contraception. Is not sexually active.  Normal periods.    Denies smoking or drug use.  Occ alcohol.    Outpatient Encounter Medications as of 07/31/2023  Medication Sig   naproxen (NAPROSYN) 500 MG tablet Take 500 mg by mouth.   [DISCONTINUED] valACYclovir  (VALTREX ) 500 MG tablet Take 500 mg by mouth 2 (two) times daily.   lidocaine  (XYLOCAINE ) 2 % jelly Apply 1 Application topically 3 (three) times daily as needed. To the affected area on perineum for pain (Patient not taking: Reported on 07/31/2023)   valACYclovir  (VALTREX ) 500 MG tablet Take 1 tablet (500 mg total) by mouth 2 (two) times daily. X 3 days. Start ASAP of symptom onset.   No facility-administered encounter medications on file as of 07/31/2023.    Past Medical History:  Diagnosis Date   Genital herpes     History reviewed. No pertinent surgical history.  History reviewed. No pertinent family history.  Social History   Socioeconomic History   Marital status: Single    Spouse name: Not on file   Number of children: Not on file   Years of education: Not on file   Highest education level: Not on file  Occupational History   Not on file  Tobacco Use   Smoking status: Never   Smokeless tobacco: Never   Substance and Sexual Activity   Alcohol use: Yes   Drug use: Not Currently   Sexual activity: Not Currently  Other Topics Concern   Not on file  Social History Narrative   Not on file   Social Drivers of Health   Financial Resource Strain: Not on file  Food Insecurity: Not on file  Transportation Needs: Not on file  Physical Activity: Not on file  Stress: Not on file  Social Connections: Not on file  Intimate Partner Violence: Not on file    Review of Systems  Constitutional:  Negative for chills, fever, malaise/fatigue and weight loss.  Eyes:  Negative for blurred vision, double vision and photophobia.  Respiratory:  Negative for cough and shortness of breath.   Cardiovascular:  Negative for chest pain, palpitations and leg swelling.  Gastrointestinal:  Negative for abdominal pain, constipation, diarrhea, nausea and vomiting.  Genitourinary:  Negative for dysuria, frequency and urgency.  Neurological:  Negative for dizziness, tingling, focal weakness and headaches.  Psychiatric/Behavioral:  Negative for depression and substance abuse. The patient is not nervous/anxious.         Objective    BP 122/80 (BP Location: Left Arm, Patient Position: Sitting)   Pulse (!) 59   Temp 97.9 F (36.6 C) (Oral)   Ht 5' (1.524 m)   Wt 140 lb (63.5 kg)   SpO2 98%  BMI 27.34 kg/m   Physical Exam Constitutional:      General: She is not in acute distress.    Appearance: She is not ill-appearing.   Eyes:     Extraocular Movements: Extraocular movements intact.     Conjunctiva/sclera: Conjunctivae normal.    Cardiovascular:     Rate and Rhythm: Normal rate.  Pulmonary:     Effort: Pulmonary effort is normal.   Musculoskeletal:     Cervical back: Normal range of motion and neck supple.   Skin:    General: Skin is warm and dry.   Neurological:     General: No focal deficit present.     Mental Status: She is alert and oriented to person, place, and time.     Cranial  Nerves: No cranial nerve deficit.     Motor: No weakness.     Coordination: Coordination normal.     Gait: Gait normal.   Psychiatric:        Mood and Affect: Mood normal.        Behavior: Behavior normal.        Thought Content: Thought content normal.         Assessment & Plan:   Problem List Items Addressed This Visit   None Visit Diagnoses       Rheumatoid arthritis, involving unspecified site, unspecified whether rheumatoid factor present (HCC)    -  Primary   Relevant Medications   naproxen (NAPROSYN) 500 MG tablet   Other Relevant Orders   Ambulatory referral to Rheumatology     HSV-2 (herpes simplex virus 2) infection       Relevant Medications   valACYclovir  (VALTREX ) 500 MG tablet     Screening for cervical cancer       Relevant Orders   Ambulatory referral to Obstetrics / Gynecology      Patient she is a pleasant 23 year old female who is new to the practice here to establish care.  History of RA, diagnosed in her teens.  Previously under the care of [facility].  Currently using naproxen as needed.  Request referral to local rheumatologist.  Referral made. Valacyclovir  prescribed. She is referral to OB/GYN for her first Pap smear. Follow-up here for fasting CPE in 2 to 3 months.   Return for fasting CPE at their convenience.   Alyson Back, NP-C

## 2023-09-18 ENCOUNTER — Encounter: Payer: Self-pay | Admitting: Family Medicine

## 2023-09-18 ENCOUNTER — Ambulatory Visit: Admitting: Family Medicine

## 2023-09-18 VITALS — BP 104/66 | HR 60 | Temp 97.6°F | Ht 60.0 in | Wt 147.0 lb

## 2023-09-18 DIAGNOSIS — Z8639 Personal history of other endocrine, nutritional and metabolic disease: Secondary | ICD-10-CM

## 2023-09-18 DIAGNOSIS — Z833 Family history of diabetes mellitus: Secondary | ICD-10-CM | POA: Diagnosis not present

## 2023-09-18 DIAGNOSIS — Z0000 Encounter for general adult medical examination without abnormal findings: Secondary | ICD-10-CM

## 2023-09-18 DIAGNOSIS — Z8349 Family history of other endocrine, nutritional and metabolic diseases: Secondary | ICD-10-CM | POA: Diagnosis not present

## 2023-09-18 DIAGNOSIS — Z23 Encounter for immunization: Secondary | ICD-10-CM

## 2023-09-18 DIAGNOSIS — Z13220 Encounter for screening for lipoid disorders: Secondary | ICD-10-CM

## 2023-09-18 DIAGNOSIS — Z124 Encounter for screening for malignant neoplasm of cervix: Secondary | ICD-10-CM

## 2023-09-18 DIAGNOSIS — Z0001 Encounter for general adult medical examination with abnormal findings: Secondary | ICD-10-CM

## 2023-09-18 DIAGNOSIS — Z1159 Encounter for screening for other viral diseases: Secondary | ICD-10-CM | POA: Diagnosis not present

## 2023-09-18 LAB — LIPID PANEL
Cholesterol: 153 mg/dL (ref 0–200)
HDL: 56.8 mg/dL (ref >39.00)
LDL Cholesterol: 77 mg/dL (ref 0–99)
NonHDL: 96.46
Total CHOL/HDL Ratio: 3
Triglycerides: 96 mg/dL (ref 0.0–149.0)
VLDL: 19.2 mg/dL (ref 0.0–40.0)

## 2023-09-18 LAB — CBC WITH DIFFERENTIAL/PLATELET
Basophils Absolute: 0.1 K/uL (ref 0.0–0.1)
Basophils Relative: 0.8 % (ref 0.0–3.0)
Eosinophils Absolute: 0.4 K/uL (ref 0.0–0.7)
Eosinophils Relative: 5.5 % — ABNORMAL HIGH (ref 0.0–5.0)
HCT: 33.7 % — ABNORMAL LOW (ref 36.0–46.0)
Hemoglobin: 10.1 g/dL — ABNORMAL LOW (ref 12.0–15.0)
Lymphocytes Relative: 25.3 % (ref 12.0–46.0)
Lymphs Abs: 2 K/uL (ref 0.7–4.0)
MCHC: 30 g/dL (ref 30.0–36.0)
MCV: 63.1 fl — ABNORMAL LOW (ref 78.0–100.0)
Monocytes Absolute: 0.7 K/uL (ref 0.1–1.0)
Monocytes Relative: 8.6 % (ref 3.0–12.0)
Neutro Abs: 4.7 K/uL (ref 1.4–7.7)
Neutrophils Relative %: 59.8 % (ref 43.0–77.0)
Platelets: 306 K/uL (ref 150.0–400.0)
RBC: 5.35 Mil/uL — ABNORMAL HIGH (ref 3.87–5.11)
RDW: 19.7 % — ABNORMAL HIGH (ref 11.5–15.5)
WBC: 7.8 K/uL (ref 4.0–10.5)

## 2023-09-18 LAB — COMPREHENSIVE METABOLIC PANEL WITH GFR
ALT: 13 U/L (ref 0–35)
AST: 22 U/L (ref 0–37)
Albumin: 4.4 g/dL (ref 3.5–5.2)
Alkaline Phosphatase: 80 U/L (ref 39–117)
BUN: 21 mg/dL (ref 6–23)
CO2: 22 meq/L (ref 19–32)
Calcium: 9.6 mg/dL (ref 8.4–10.5)
Chloride: 104 meq/L (ref 96–112)
Creatinine, Ser: 0.7 mg/dL (ref 0.40–1.20)
GFR: 121.39 mL/min (ref >60.00)
Glucose, Bld: 85 mg/dL (ref 70–99)
Potassium: 4.1 meq/L (ref 3.5–5.1)
Sodium: 141 meq/L (ref 135–145)
Total Bilirubin: 0.2 mg/dL (ref 0.2–1.2)
Total Protein: 7.9 g/dL (ref 6.0–8.3)

## 2023-09-18 LAB — TSH: TSH: 3.36 u[IU]/mL (ref 0.35–5.50)

## 2023-09-18 LAB — FERRITIN: Ferritin: 4 ng/mL — ABNORMAL LOW (ref 10.0–291.0)

## 2023-09-18 LAB — T4, FREE: Free T4: 0.88 ng/dL (ref 0.60–1.60)

## 2023-09-18 NOTE — Progress Notes (Signed)
 Complete physical exam  Patient: Diamond Ward   DOB: 2000-07-15   23 y.o. Female  MRN: [id number]  Subjective:    Chief Complaint  Patient presents with   Annual Exam    fasting   Discussed the use of AI scribe software for clinical note transcription with the patient, who gave verbal consent to proceed.  History of Present Illness Diamond Ward is a 23 year old female who presents for a preventive health care annual exam.  General health status - Generally feels well with no current health issues - No joint pain or skin changes - No abnormal bowel movements  Menstrual and gynecologic history - Last menstrual period is ongoing - Not currently sexually active - Never had a Pap smear - Does not have an OB GYN - Not on birth control  Vision - Wears glasses - Up to date with eye appointments    Health Maintenance  Topic Date Due   HIV Screening  Never done   Hepatitis C Screening  Never done   Hepatitis B Vaccine (1 of 3 - 19+ 3-dose series) Never done   Pap Smear  Never done   Flu Shot  09/12/2023   COVID-19 Vaccine (1) 08/10/2024*   DTaP/Tdap/Td vaccine (8 - Td or Tdap) 09/17/2033   HPV Vaccine  Completed   Meningitis B Vaccine  Completed  *Topic was postponed. The date shown is not the original due date.    Wears seatbelt always, uses sunscreen, smoke detectors in home and functioning, does not text while driving, feels safe in home environment.  Depression screening:    07/31/2023    8:23 AM  Depression screen PHQ 2/9  Decreased Interest 0  Down, Depressed, Hopeless 0  PHQ - 2 Score 0  Altered sleeping 1  Tired, decreased energy 0  Change in appetite 0  Feeling bad or failure about yourself  0  Trouble concentrating 0  Moving slowly or fidgety/restless 0  Suicidal thoughts 0  PHQ-9 Score 1  Difficult doing work/chores Not difficult at all   Anxiety Screening:    07/31/2023    8:23 AM  GAD 7 : Generalized Anxiety Score   Nervous, Anxious, on Edge 0  Control/stop worrying 0  Worry too much - different things 0  Trouble relaxing 0  Restless 0  Easily annoyed or irritable 0  Afraid - awful might happen 0  Total GAD 7 Score 0  Anxiety Difficulty Not difficult at all    Vision:Within last year and Dental: No current dental problems and Receives regular dental care  Patient Active Problem List   Diagnosis Date Noted   Seropositive rheumatoid arthritis (HCC) 08/18/2018   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 No pertinent surgical history. Social History   Tobacco Use   Smoking status: Never   Smokeless tobacco: Never  Substance Use Topics   Alcohol use: Yes    Comment: soc   Drug use: Not Currently      Patient Care Team: Lendia Boby CROME, NP-C as PCP - General (Family Medicine)   Outpatient Medications Prior to Visit  Medication Sig   naproxen (NAPROSYN) 500 MG tablet Take 500 mg by mouth.   valACYclovir  (VALTREX ) 500 MG  tablet Take 1 tablet (500 mg total) by mouth 2 (two) times daily. X 3 days. Start ASAP of symptom onset.   No facility-administered medications prior to visit.    Review of Systems  Constitutional:  Negative for chills, fever, malaise/fatigue and weight loss.  HENT:  Negative for congestion, ear pain, sinus pain and sore throat.   Eyes:  Negative for blurred vision, double vision and pain.  Respiratory:  Negative for cough, shortness of breath and wheezing.   Cardiovascular:  Negative for chest pain, palpitations and leg swelling.  Gastrointestinal:  Negative for abdominal pain, constipation, diarrhea, nausea and vomiting.  Genitourinary:  Negative for dysuria, frequency and  urgency.  Musculoskeletal:  Negative for back pain, joint pain and myalgias.  Skin:  Negative for rash.  Neurological:  Negative for dizziness, tingling, focal weakness and headaches.  Endo/Heme/Allergies:  Does not bruise/bleed easily.  Psychiatric/Behavioral:  Negative for depression and suicidal ideas. The patient is not nervous/anxious.        Objective:    BP 104/66 (BP Location: Left Arm, Patient Position: Sitting)   Pulse 60   Temp 97.6 F (36.4 C) (Temporal)   Ht 5' (1.524 m)   Wt 147 lb (66.7 kg)   SpO2 99%   BMI 28.71 kg/m  BP Readings from Last 3 Encounters:  09/18/23 104/66  07/31/23 122/80  04/22/23 114/78   Wt Readings from Last 3 Encounters:  09/18/23 147 lb (66.7 kg)  07/31/23 140 lb (63.5 kg)  05/17/22 145 lb (65.8 kg)    Physical Exam Constitutional:      General: She is not in acute distress.    Appearance: She is not ill-appearing.  HENT:     Right Ear: Tympanic membrane, ear canal and external ear normal.     Left Ear: Tympanic membrane, ear canal and external ear normal.     Nose: Nose normal.     Mouth/Throat:     Mouth: Mucous membranes are moist.     Pharynx: Oropharynx is clear.  Eyes:     Extraocular Movements: Extraocular movements intact.     Conjunctiva/sclera: Conjunctivae normal.     Pupils: Pupils are equal, round, and reactive to light.  Neck:     Thyroid: No thyroid mass, thyromegaly or thyroid tenderness.  Cardiovascular:     Rate and Rhythm: Normal rate and regular rhythm.     Pulses: Normal pulses.     Heart sounds: Normal heart sounds.  Pulmonary:     Effort: Pulmonary effort is normal.     Breath sounds: Normal breath sounds.  Abdominal:     General: Bowel sounds are normal.     Palpations: Abdomen is soft.     Tenderness: There is no abdominal tenderness. There is no right CVA tenderness, left CVA tenderness, guarding or rebound.  Musculoskeletal:        General: Normal range of motion.     Cervical back: Normal  range of motion and neck supple. No tenderness.     Right lower leg: No edema.     Left lower leg: No edema.  Lymphadenopathy:     Cervical: No cervical adenopathy.  Skin:    General: Skin is warm and dry.     Findings: No lesion or rash.  Neurological:     General: No focal deficit present.     Mental Status: She is alert and oriented to person, place, and time.     Cranial Nerves: No cranial nerve deficit.  Sensory: No sensory deficit.     Motor: No weakness.     Gait: Gait normal.  Psychiatric:        Mood and Affect: Mood normal.        Behavior: Behavior normal.        Thought Content: Thought content normal.      No results found for any visits on 09/18/23.    Assessment & Plan:    Routine Health Maintenance and Physical Exam  Problem List Items Addressed This Visit   None Visit Diagnoses       Encounter for general adult medical examination with abnormal findings    -  Primary     Screening for cervical cancer       Relevant Orders   Ambulatory referral to Obstetrics / Gynecology     Family history of diabetes mellitus (DM)         Encounter for screening for other viral diseases       Relevant Orders   Hepatitis C antibody   HIV Antibody (routine testing w rflx)     Family history of thyroid disease       Relevant Orders   TSH   T4, free     Screening for lipid disorders       Relevant Orders   Lipid panel     History of iron deficiency       Relevant Orders   CBC with Differential/Platelet   Comprehensive metabolic panel with GFR   Ferritin     Need for Tdap vaccination       Relevant Orders   Tdap vaccine greater than or equal to 7yo IM (Completed)      Assessment and Plan Assessment & Plan Adult Wellness Visit Annual preventive health care exam with no current complaints. Family history includes diabetes, thyroid disorder and rheumatoid arthritis. No surgeries, not on birth control, and not sexually active. No Pap smear history. No  significant alcohol, drug use, smoking, or vaping. Regular dental and vision care. No exercise routine but maintains a well-balanced diet. - Order blood work including HIV and hepatitis C testing. - Refer to OB GYN for Pap smear. - Administer Tdap vaccine and counsel on its importance, including tetanus, diphtheria, and pertussis components. - Advise on maintaining a well-balanced diet and monitoring sugar and carbohydrate intake due to family history of diabetes.    Return in about 1 year (around 09/17/2024).     Boby Mackintosh, NP-C

## 2023-09-18 NOTE — Patient Instructions (Signed)
 I referred you to an OB/GYN and they will call you to schedule a visit.  This is for your Pap smear, breast and pelvic exams and to discuss contraception if you would like.  Remember to call and schedule with your rheumatologist

## 2023-09-19 LAB — HIV ANTIBODY (ROUTINE TESTING W REFLEX): HIV 1&2 Ab, 4th Generation: NONREACTIVE

## 2023-09-19 LAB — HEPATITIS C ANTIBODY: Hepatitis C Ab: NONREACTIVE

## 2023-09-23 ENCOUNTER — Ambulatory Visit: Payer: Self-pay | Admitting: Family Medicine

## 2024-01-03 ENCOUNTER — Ambulatory Visit (INDEPENDENT_AMBULATORY_CARE_PROVIDER_SITE_OTHER)

## 2024-01-03 ENCOUNTER — Ambulatory Visit (HOSPITAL_COMMUNITY)
Admission: RE | Admit: 2024-01-03 | Discharge: 2024-01-03 | Disposition: A | Discharge status: Home / Self Care | Source: Ambulatory Visit | Attending: Emergency Medicine | Admitting: Emergency Medicine

## 2024-01-03 ENCOUNTER — Encounter (HOSPITAL_COMMUNITY): Payer: Self-pay

## 2024-01-03 VITALS — BP 122/82 | HR 96 | Temp 99.2°F | Resp 16

## 2024-01-03 DIAGNOSIS — M940 Chondrocostal junction syndrome [Tietze]: Secondary | ICD-10-CM

## 2024-01-03 DIAGNOSIS — R051 Acute cough: Secondary | ICD-10-CM | POA: Diagnosis not present

## 2024-01-03 DIAGNOSIS — J069 Acute upper respiratory infection, unspecified: Secondary | ICD-10-CM | POA: Diagnosis not present

## 2024-01-03 DIAGNOSIS — R059 Cough, unspecified: Secondary | ICD-10-CM | POA: Diagnosis not present

## 2024-01-03 DIAGNOSIS — R079 Chest pain, unspecified: Secondary | ICD-10-CM | POA: Diagnosis not present

## 2024-01-03 HISTORY — DX: Unspecified osteoarthritis, unspecified site: M19.90

## 2024-01-03 MED ORDER — PROMETHAZINE-DM 6.25-15 MG/5ML PO SYRP: 5.0000 mL | ORAL_SOLUTION | Freq: Four times a day (QID) | ORAL | 0 refills | Status: AC | PRN

## 2024-01-03 MED ORDER — IBUPROFEN 600 MG PO TABS: 600.0000 mg | ORAL_TABLET | Freq: Four times a day (QID) | ORAL | 0 refills | Status: AC | PRN

## 2024-01-03 MED ORDER — METHOCARBAMOL 500 MG PO TABS: 500.0000 mg | ORAL_TABLET | Freq: Two times a day (BID) | ORAL | 0 refills | Status: AC

## 2024-01-03 MED ORDER — ACETAMINOPHEN 500 MG PO TABS: 500.0000 mg | ORAL_TABLET | Freq: Four times a day (QID) | ORAL | 0 refills | Status: AC | PRN

## 2024-01-03 NOTE — Discharge Instructions (Addendum)
 Chest x-ray did not show evidence of pneumonia, please stop taking the penicillin.  Your infection is most likely viral, these typically last 5 to 7 days in duration.  For any fever, body aches or pain you can alternate between Tylenol  and ibuprofen  every 4-6 hours.  For your rib cage pain please splint the ribs and use the Robaxin  as needed.  For cough you can take the syrup up to 4 times daily.  Do not drink alcohol or drive on this medication as it can cause drowsiness.  Symptoms should improve over the next 3 to 5 days.  If no improvement or any changes return to clinic for reevaluation.

## 2024-01-03 NOTE — ED Provider Notes (Signed)
 [facility]    CSN: [id number] Arrival date & time: 01/03/24  1628      History   Chief Complaint Chief Complaint  Patient presents with   appt 430    HPI Diamond Ward is a 23 y.o. female.   Patient declines language interpreter, is able to speak English.  Reports concern for cough over the past 2 or 3 days.  She did try a few doses of penicillin and Mucinex that have not helped.  She is having right sided chest pains when she coughs, deep breathes or moves her right arm.  Has not had any trauma or falls.  Endorses some wheezing and shortness of breath with coughing.  Denies fevers.  Denies nasal congestion or rhinorrhea.  The history is provided by the patient and medical records.    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 No pertinent surgical history.  OB History   No obstetric history on file.      Home Medications    Prior to Admission medications   Medication Sig Start Date End Date Taking? Authorizing Provider  acetaminophen  (TYLENOL ) 500 MG tablet Take 1 tablet (500 mg total) by mouth every 6 (six) hours as needed. 01/03/24  Yes Keelia Graybill  N, FNP  ibuprofen  (ADVIL ) 600 MG tablet Take 1 tablet (600 mg total) by mouth every 6 (six) hours as needed. 01/03/24  Yes Fahim Kats  N, FNP  methocarbamol  (ROBAXIN ) 500 MG tablet Take 1 tablet (500 mg total) by mouth 2 (two) times daily. 01/03/24  Yes Maleek Craver  N, FNP  promethazine -dextromethorphan (PROMETHAZINE -DM) 6.25-15 MG/5ML syrup Take 5 mLs by mouth 4 (four)  times daily as needed for cough. 01/03/24  Yes Dreama, Argil Mahl  N, FNP    Family History Family History  Problem Relation Age of Onset   Rheum arthritis Father     Social History Social History   Tobacco Use   Smoking status: Never   Smokeless tobacco: Never  Substance Use Topics   Alcohol use: Yes    Comment: soc   Drug use: Not Currently     Allergies   Iron sucrose   Review of Systems Review of Systems  Per HPI  Physical Exam Triage Vital Signs ED Triage Vitals  Encounter Vitals Group     BP 01/03/24 1637 122/82     Girls Systolic BP Percentile --      Girls Diastolic BP Percentile --      Boys Systolic BP Percentile --      Boys Diastolic BP Percentile --      Pulse Rate 01/03/24 1637 96     Resp 01/03/24 1637 16     Temp 01/03/24 1637 99.2 F (37.3 C)     Temp Source 01/03/24 1637 Oral     SpO2 01/03/24 1637 96 %     Weight --      Height --      Head Circumference --      Peak Flow --      Pain Score 01/03/24 1636 7     Pain Loc --  Pain Education --      Exclude from Growth Chart --    No data found.  Updated Vital Signs BP 122/82 (BP Location: Left Arm)   Pulse 96   Temp 99.2 F (37.3 C) (Oral)   Resp 16   LMP 12/26/2023 (Approximate)   SpO2 96%   Visual Acuity Right Eye Distance:   Left Eye Distance:   Bilateral Distance:    Right Eye Near:   Left Eye Near:    Bilateral Near:     Physical Exam Vitals and nursing note reviewed.  Constitutional:      Appearance: Normal appearance.  HENT:     Head: Normocephalic and atraumatic.     Right Ear: External ear normal.     Left Ear: External ear normal.     Nose: Nose normal.     Mouth/Throat:     Mouth: Mucous membranes are moist.     Pharynx: Posterior oropharyngeal erythema present.  Eyes:     Conjunctiva/sclera: Conjunctivae normal.  Cardiovascular:     Rate and Rhythm: Normal rate and regular rhythm.     Heart sounds: Normal heart sounds. No murmur heard. Pulmonary:      Effort: Pulmonary effort is normal. No respiratory distress.     Breath sounds: Normal breath sounds. No wheezing.  Chest:     Chest wall: Tenderness present.    Skin:    General: Skin is warm and dry.  Neurological:     General: No focal deficit present.     Mental Status: She is alert and oriented to person, place, and time.  Psychiatric:        Mood and Affect: Mood normal.        Behavior: Behavior normal. Behavior is cooperative.      UC Treatments / Results  Labs (all labs ordered are listed, but only abnormal results are displayed) Labs Reviewed - No data to display  EKG   Radiology DG Chest 2 View Result Date: 01/03/2024 CLINICAL DATA:  Cough, chest pains on right side. EXAM: CHEST - 2 VIEW COMPARISON:  None Available. FINDINGS: The heart size and mediastinal contours are within normal limits. No consolidation, effusion, or pneumothorax is seen. No acute osseous abnormality. IMPRESSION: No active cardiopulmonary disease. Electronically Signed   By: Leita Birmingham M.D.   On: 01/03/2024 17:09    Procedures Procedures (including critical care time)  Medications Ordered in UC Medications - No data to display  Initial Impression / Assessment and Plan / UC Course  I have reviewed the triage vital signs and the nursing notes.  Pertinent labs & imaging results that were available during my care of the patient were reviewed by me and considered in my medical decision making (see chart for details).  Vitals and triage reviewed, patient is hemodynamically stable.  Lungs vesicular, heart with regular rate and rhythm.  Right anterior chest wall pain is reproducible with palpation, coughing and deep breathing, suspect costochondritis.  Chest x-ray by my interpretation without evidence of pneumonia.  Confirmed with radiology overread.  Suspect viral URI with cough, symptomatic management discussed.  Plan of care, follow-up care return precautions given, no questions at  this time.  Work note provided.    Final Clinical Impressions(s) / UC Diagnoses   Final diagnoses:  Viral URI with cough  Costochondritis     Discharge Instructions      Chest x-ray did not show evidence of pneumonia, please stop taking the penicillin.  Your infection is most  likely viral, these typically last 5 to 7 days in duration.  For any fever, body aches or pain you can alternate between Tylenol  and ibuprofen  every 4-6 hours.  For your rib cage pain please splint the ribs and use the Robaxin  as needed.  For cough you can take the syrup up to 4 times daily.  Do not drink alcohol or drive on this medication as it can cause drowsiness.  Symptoms should improve over the next 3 to 5 days.  If no improvement or any changes return to clinic for reevaluation.     ED Prescriptions     Medication Sig Dispense Auth. Provider   methocarbamol  (ROBAXIN ) 500 MG tablet Take 1 tablet (500 mg total) by mouth 2 (two) times daily. 20 tablet Dreama, Beya Tipps  N, FNP   acetaminophen  (TYLENOL ) 500 MG tablet Take 1 tablet (500 mg total) by mouth every 6 (six) hours as needed. 30 tablet Dreama, Yacoub Diltz  N, FNP   ibuprofen  (ADVIL ) 600 MG tablet Take 1 tablet (600 mg total) by mouth every 6 (six) hours as needed. 30 tablet Dreama, Jakiah Bienaime  N, FNP   promethazine -dextromethorphan (PROMETHAZINE -DM) 6.25-15 MG/5ML syrup Take 5 mLs by mouth 4 (four) times daily as needed for cough. 118 mL Dreama, Jesstin Studstill  N, FNP      PDMP not reviewed this encounter.   Dreama, Angellica Maddison  N, FNP 01/03/24 1730

## 2024-01-03 NOTE — ED Triage Notes (Signed)
 Pt reports had cough for 2-3 days. Tried taking Mucinex and Mexican antibiotics that havent helped. Pt reports has right chest pains when she coughs.

## 2024-03-05 ENCOUNTER — Ambulatory Visit (HOSPITAL_COMMUNITY)
Admission: EM | Admit: 2024-03-05 | Discharge: 2024-03-05 | Disposition: A | Discharge status: Home / Self Care | Attending: Physician Assistant | Admitting: Physician Assistant

## 2024-03-05 ENCOUNTER — Encounter (HOSPITAL_COMMUNITY): Payer: Self-pay

## 2024-03-05 DIAGNOSIS — R202 Paresthesia of skin: Secondary | ICD-10-CM | POA: Diagnosis present

## 2024-03-05 DIAGNOSIS — R200 Anesthesia of skin: Secondary | ICD-10-CM | POA: Diagnosis present

## 2024-03-05 DIAGNOSIS — K529 Noninfective gastroenteritis and colitis, unspecified: Secondary | ICD-10-CM | POA: Diagnosis present

## 2024-03-05 DIAGNOSIS — R112 Nausea with vomiting, unspecified: Secondary | ICD-10-CM | POA: Insufficient documentation

## 2024-03-05 DIAGNOSIS — R197 Diarrhea, unspecified: Secondary | ICD-10-CM | POA: Diagnosis present

## 2024-03-05 DIAGNOSIS — R1010 Upper abdominal pain, unspecified: Secondary | ICD-10-CM | POA: Insufficient documentation

## 2024-03-05 LAB — CBC WITH DIFFERENTIAL/PLATELET
Abs Immature Granulocytes: 0.03 K/uL (ref 0.00–0.07)
Basophils Absolute: 0 K/uL (ref 0.0–0.1)
Basophils Relative: 0 %
Eosinophils Absolute: 0 K/uL (ref 0.0–0.5)
Eosinophils Relative: 0 %
HCT: 37.5 % (ref 36.0–46.0)
Hemoglobin: 11.2 g/dL — ABNORMAL LOW (ref 12.0–15.0)
Immature Granulocytes: 0 %
Lymphocytes Relative: 7 %
Lymphs Abs: 0.7 K/uL (ref 0.7–4.0)
MCH: 19.5 pg — ABNORMAL LOW (ref 26.0–34.0)
MCHC: 29.9 g/dL — ABNORMAL LOW (ref 30.0–36.0)
MCV: 65.2 fL — ABNORMAL LOW (ref 80.0–100.0)
Monocytes Absolute: 0.5 K/uL (ref 0.1–1.0)
Monocytes Relative: 4 %
Neutro Abs: 9.7 K/uL — ABNORMAL HIGH (ref 1.7–7.7)
Neutrophils Relative %: 89 %
Platelets: 311 K/uL (ref 150–400)
RBC: 5.75 MIL/uL — ABNORMAL HIGH (ref 3.87–5.11)
RDW: 18.7 % — ABNORMAL HIGH (ref 11.5–15.5)
Smear Review: NORMAL
WBC: 11 K/uL — ABNORMAL HIGH (ref 4.0–10.5)
nRBC: 0 % (ref 0.0–0.2)

## 2024-03-05 LAB — POCT URINE DIPSTICK
Blood, UA: NEGATIVE
Glucose, UA: NEGATIVE mg/dL
Leukocytes, UA: NEGATIVE
Nitrite, UA: NEGATIVE
Protein Ur, POC: 30 mg/dL — AB
Spec Grav, UA: 1.025
Urobilinogen, UA: 0.2 U/dL
pH, UA: 6

## 2024-03-05 LAB — COMPREHENSIVE METABOLIC PANEL WITH GFR
ALT: 23 U/L (ref 0–44)
AST: 26 U/L (ref 15–41)
Albumin: 4.7 g/dL (ref 3.5–5.0)
Alkaline Phosphatase: 105 U/L (ref 38–126)
Anion gap: 14 (ref 5–15)
BUN: 20 mg/dL (ref 6–20)
CO2: 23 mmol/L (ref 22–32)
Calcium: 9.4 mg/dL (ref 8.9–10.3)
Chloride: 102 mmol/L (ref 98–111)
Creatinine, Ser: 0.61 mg/dL (ref 0.44–1.00)
GFR, Estimated: >60 mL/min
Glucose, Bld: 116 mg/dL — ABNORMAL HIGH (ref 70–99)
Potassium: 3.8 mmol/L (ref 3.5–5.1)
Sodium: 139 mmol/L (ref 135–145)
Total Bilirubin: 0.6 mg/dL (ref 0.0–1.2)
Total Protein: 8.7 g/dL — ABNORMAL HIGH (ref 6.5–8.1)

## 2024-03-05 LAB — VITAMIN B12: Vitamin B-12: 752 pg/mL (ref 180–914)

## 2024-03-05 LAB — POCT URINE PREGNANCY: Preg Test, Ur: NEGATIVE

## 2024-03-05 LAB — TSH: TSH: 0.558 u[IU]/mL (ref 0.350–4.500)

## 2024-03-05 LAB — LIPASE, BLOOD: Lipase: 13 U/L (ref 11–51)

## 2024-03-05 MED ORDER — ONDANSETRON 4 MG PO TBDP: 4.0000 mg | ORAL_TABLET | Freq: Three times a day (TID) | ORAL | 0 refills | Status: AC | PRN

## 2024-03-05 MED ORDER — ONDANSETRON 4 MG PO TBDP
ORAL_TABLET | ORAL | Status: AC
  Filled 2024-03-05: qty 1

## 2024-03-05 MED ORDER — ONDANSETRON 4 MG PO TBDP
4.0000 mg | ORAL_TABLET | Freq: Once | ORAL | Status: AC
  Administered 2024-03-05: 4 mg via ORAL

## 2024-03-05 NOTE — ED Triage Notes (Addendum)
 Vomiting and diarrhea since this morning; pt states that she is having a tingling sensation in her hands and feet today as well. C/O epigastric abd pain

## 2024-03-05 NOTE — Discharge Instructions (Signed)
 I am glad that feeling better with the medication.  Use Zofran every 8 hours for the next 24 hours to help with nausea/vomiting.  After that, you can use it as needed.  Eat a bland diet and avoid anything that is spicy/acidic/fatty.  Make sure that you are drinking plenty of water.  We will contact you if any of your blood work is abnormal.  If anything worsens and you have severe abdominal pain, nausea/vomiting even with the medication, blood in your stool, blood in your vomit, high fever, weakness you need to go to the ER.  I am not sure what is causing your numbness and tingling but suspect it is related to your illness.  Make sure that you are drinking plenty of fluid and eat small frequent meals.  We have drawn some blood work to investigate potential causes and we will contact you if any of this is abnormal; monitor your MyChart for the results as we will only call you if something is abnormal and changes our treatment plan.  Follow-up with neurology if your symptoms are not getting better and all of your blood work is normal.  If anything worsens and you have spread of numbness and tingling, weakness, vision change, severe headache, trouble speaking, trouble swallowing you need to be seen immediately.

## 2024-03-05 NOTE — ED Provider Notes (Signed)
 " [facility]    CSN: [id number] Arrival date & time: 03/05/24  1638      History   Chief Complaint Chief Complaint  Patient presents with   Emesis    HPI Diamond Ward is a 24 y.o. female.   Patient presents today with a 12-hour history of nausea, vomiting, diarrhea.  She reports associated abdominal discomfort that is migrating but worse in her upper abdomen, rated 7 on a 0-10 pain scale, no alleviating factors identified.  She denies any suspicious food intake, medication changes, recent antibiotic use, recent travel.  She does have a history of H. pylori but reports completing course of treatment when she was much younger and had been released from follow-up by GI.  Denies previous abdominal surgery.  She does have a history of rheumatoid arthritis but is not currently receiving any treatment.  She denies any fever, cough, congestion, additional symptoms.  She is confident that she is not pregnant.  She has been having difficulty keeping anything down since her symptoms began and reports multiple episodes of emesis as well as diarrhea that she denies any associated hematemesis, melena, hematochezia.  Over the past few hours she has also developed numbness and paresthesias in bilateral hands and feet.  She reports this involves all 5 fingers and toes.  She has not tried any over-the-counter medication for symptom management.  Denies history of diabetes or neurological condition.    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 No pertinent surgical  history.  OB History   No obstetric history on file.      Home Medications    Prior to Admission medications  Medication Sig Start Date End Date Taking? Authorizing Provider  ondansetron (ZOFRAN-ODT) 4 MG disintegrating tablet Take 1 tablet (4 mg total) by mouth every 8 (eight) hours as needed for nausea or vomiting. 03/05/24  Yes Elyas Villamor K, PA-C  acetaminophen  (TYLENOL ) 500 MG tablet Take 1 tablet (500 mg total) by mouth every 6 (six) hours as needed. 01/03/24   Ball, [place]  G, FNP  ibuprofen  (ADVIL ) 600 MG tablet Take 1 tablet (600 mg total) by mouth every 6 (six) hours as needed. 01/03/24   Ball, [place]  G, FNP  methocarbamol  (ROBAXIN ) 500 MG tablet Take 1 tablet (500 mg total) by mouth 2 (two) times daily. 01/03/24   Ball, [place]  G, FNP  promethazine -dextromethorphan (PROMETHAZINE -DM) 6.25-15 MG/5ML syrup Take 5 mLs by mouth 4 (four) times daily as needed for cough. 01/03/24   Mercer Renelda MATSU, FNP    Family History Family History  Problem Relation Age of Onset   Rheum arthritis Father     Social History Social History[1]   Allergies   Iron sucrose   Review of Systems Review of Systems  Constitutional:  Positive for activity change and appetite change. Negative for fatigue and fever.  HENT:  Negative for congestion, sinus pressure, sneezing and sore throat.   Respiratory:  Negative for cough and shortness of breath.   Cardiovascular:  Negative for chest pain.  Gastrointestinal:  Positive for abdominal pain, diarrhea, nausea and vomiting.  Neurological:  Positive for numbness (hands and feet). Negative for weakness and headaches.     Physical Exam Triage Vital Signs ED Triage Vitals  Encounter Vitals Group     BP 03/05/24 1759 119/76     Girls Systolic BP Percentile --      Girls Diastolic BP Percentile --      Boys Systolic BP Percentile --      Boys Diastolic BP Percentile --      Pulse Rate 03/05/24 1759 95     Resp 03/05/24 1759 16     Temp 03/05/24  1759 97.7 F (36.5 C)     Temp Source 03/05/24 1759 Oral     SpO2 03/05/24 1759 97 %     Weight --      Height --      Head Circumference --      Peak Flow --      Pain Score 03/05/24 1800 7     Pain Loc --      Pain Education --      Exclude from Growth Chart --    No data found.  Updated Vital Signs BP 119/76 (BP Location: Right Arm)   Pulse 95   Temp 97.7 F (36.5 C) (Oral)   Resp 16   LMP 02/24/2024   SpO2 97%   Visual Acuity Right Eye Distance:   Left Eye Distance:   Bilateral Distance:    Right Eye Near:   Left Eye Near:    Bilateral Near:     Physical Exam Vitals reviewed.  Constitutional:      General: She is awake. She is not in acute distress.    Appearance: Normal appearance. She is well-developed. She is not ill-appearing.     Comments: Very pleasant female appears stated age in no acute distress sitting comfortable in exam room  HENT:     Head: Normocephalic and atraumatic.  Cardiovascular:     Rate and Rhythm: Normal rate and regular rhythm.     Heart sounds: Normal heart sounds, S1 normal and S2 normal. No murmur heard. Pulmonary:     Effort: Pulmonary effort is normal.     Breath sounds: Normal breath sounds. No wheezing, rhonchi or rales.     Comments: Clear to auscultation bilaterally Abdominal:     General: Bowel sounds are normal.     Palpations: Abdomen is soft.     Tenderness: There is no abdominal tenderness. There is no right CVA tenderness, left CVA tenderness, guarding or rebound.     Comments: Benign abdominal exam.  No significant tenderness palpation.  No evidence of acute abdomen on physical exam.  Musculoskeletal:     Comments: Strength 5/5 bilateral upper and lower extremities.  Normal pincer grip strength.  Hands are neurovascularly intact.  Psychiatric:        Behavior: Behavior is cooperative.      UC Treatments / Results  Labs (all labs ordered are listed, but only abnormal results are displayed) Labs Reviewed  POCT  URINE DIPSTICK - Abnormal; Notable for the following components:      Result Value   Bilirubin, UA small (*)    Ketones, POC UA moderate (40) (*)    Protein Ur, POC =30 (*)    All other components within normal limits  POCT URINE PREGNANCY - Normal  CBC WITH DIFFERENTIAL/PLATELET  COMPREHENSIVE METABOLIC PANEL WITH GFR  LIPASE, BLOOD  TSH  VITAMIN B12  HEMOGLOBIN A1C    EKG   Radiology No results  found.  Procedures Procedures (including critical care time)  Medications Ordered in UC Medications  ondansetron (ZOFRAN-ODT) disintegrating tablet 4 mg (4 mg Oral Given 03/05/24 1858)    Initial Impression / Assessment and Plan / UC Course  I have reviewed the triage vital signs and the nursing notes.  Pertinent labs & imaging results that were available during my care of the patient were reviewed by me and considered in my medical decision making (see chart for details).     Patient is well-appearing, afebrile, nontoxic, nontachycardic.  Vital signs and physical exam are reassuring with no indication for emergent evaluation or imaging.  Urine pregnancy was negative.  UA showed ketones but no significant dehydration.  She was given Zofran in clinic and able to pass oral challenge by drinking a ginger ale and eating pretzels.  She was given a prescription for Zofran with instruction to use this on a scheduled basis for the next 24 hours and then decrease use to as needed thereafter.  Recommended lifestyle and dietary modifications for additional symptom relief.  Basic blood work including CBC, CMP, lipase were obtained and are pending.  We will contact her if these are abnormal and change our treatment plan.  Discussed that if she has any severe abdominal pain, focal abdominal pain, fever, nausea/vomiting despite antiemetic medication, weakness she needs to be seen emergently.  Return precautions given.  Excuse note provided.  Unclear etiology of stocking-glove distribution of numbness  and paresthesias.  Basic blood work was obtained including CBC, CMP, TSH, B12, A1c.  We will contact her if any of this is abnormal.  We discussed that if her workup is negative and she continues to have the symptoms it is reasonable to follow-up with neurology to consider nerve conduction studies and was given the contact information for local provider with instruction to call to schedule an appointment.  We discussed that if she has any worsening symptoms including spread of distribution of numbness/paresthesias, focal weakness, dysarthria, dysphagia, severe headache or dizziness she needs to be seen emergently.  Return precautions given.  Excuse note provided.  Patient and mother expressed understanding and agreement with treatment plan.  Final Clinical Impressions(s) / UC Diagnoses   Final diagnoses:  Nausea vomiting and diarrhea  Gastroenteritis  Upper abdominal pain  Numbness and tingling in both hands  Numbness and tingling of foot     Discharge Instructions      I am glad that feeling better with the medication.  Use Zofran every 8 hours for the next 24 hours to help with nausea/vomiting.  After that, you can use it as needed.  Eat a bland diet and avoid anything that is spicy/acidic/fatty.  Make sure that you are drinking plenty of water.  We will contact you if any of your blood work is abnormal.  If anything worsens and you have severe abdominal pain, nausea/vomiting even with the medication, blood in your stool, blood in your vomit, high fever, weakness you need to go to the ER.  I am not sure what is causing your numbness and tingling but suspect it is related to your illness.  Make sure that you are drinking plenty of fluid and eat small frequent meals.  We have drawn some blood work to investigate potential causes and we will contact you if any of this is abnormal; monitor your MyChart for the results as we will only call you if something is abnormal and changes our treatment plan.   Follow-up with neurology if your  symptoms are not getting better and all of your blood work is normal.  If anything worsens and you have spread of numbness and tingling, weakness, vision change, severe headache, trouble speaking, trouble swallowing you need to be seen immediately.     ED Prescriptions     Medication Sig Dispense Auth. Provider   ondansetron (ZOFRAN-ODT) 4 MG disintegrating tablet Take 1 tablet (4 mg total) by mouth every 8 (eight) hours as needed for nausea or vomiting. 20 tablet Diamond Matusik K, PA-C      PDMP not reviewed this encounter.     [1]  Social History Tobacco Use   Smoking status: Never   Smokeless tobacco: Never  Substance Use Topics   Alcohol use: Yes    Comment: soc   Drug use: Not Currently     Diamond Ward 03/05/24 1956  "

## 2024-03-06 LAB — HEMOGLOBIN A1C
Hgb A1c MFr Bld: 5.6 % (ref 4.8–5.6)
Mean Plasma Glucose: 114.02 mg/dL

## 2024-03-08 ENCOUNTER — Ambulatory Visit (HOSPITAL_COMMUNITY): Payer: Self-pay | Admitting: Physician Assistant

## 2024-09-21 ENCOUNTER — Encounter: Admitting: Family Medicine
